# Patient Record
Sex: Female | Born: 1980 | Race: Black or African American | Hispanic: No | Marital: Married | Smoking: Never smoker
Health system: Southern US, Community
[De-identification: ages and names within clinical notes are randomized; demographics above are authoritative.]

## PROBLEM LIST (undated history)

## (undated) DIAGNOSIS — K589 Irritable bowel syndrome without diarrhea: Secondary | ICD-10-CM

## (undated) HISTORY — DX: Irritable bowel syndrome without diarrhea: K58.9

---

## 2004-05-17 ENCOUNTER — Ambulatory Visit: Payer: Self-pay | Admitting: Gastroenterology

## 2008-01-04 ENCOUNTER — Encounter: Admission: RE | Admit: 2008-01-04 | Discharge: 2008-01-04 | Payer: Self-pay | Admitting: Unknown Physician Specialty

## 2008-01-09 ENCOUNTER — Emergency Department (HOSPITAL_BASED_OUTPATIENT_CLINIC_OR_DEPARTMENT_OTHER): Admission: EM | Admit: 2008-01-09 | Discharge: 2008-01-09 | Payer: Self-pay | Admitting: Emergency Medicine

## 2008-07-24 ENCOUNTER — Emergency Department (HOSPITAL_COMMUNITY): Admission: EM | Admit: 2008-07-24 | Discharge: 2008-07-24 | Payer: Self-pay | Admitting: Emergency Medicine

## 2011-10-31 ENCOUNTER — Encounter: Payer: Self-pay | Admitting: *Deleted

## 2011-10-31 ENCOUNTER — Emergency Department
Admission: EM | Admit: 2011-10-31 | Discharge: 2011-10-31 | Disposition: A | Discharge status: Home / Self Care | Payer: 59 | Source: Home / Self Care

## 2011-10-31 DIAGNOSIS — R21 Rash and other nonspecific skin eruption: Secondary | ICD-10-CM

## 2011-10-31 DIAGNOSIS — R198 Other specified symptoms and signs involving the digestive system and abdomen: Secondary | ICD-10-CM

## 2011-10-31 NOTE — ED Provider Notes (Signed)
History     CSN: [id number]  Arrival date & time 10/31/11  1533   First MD Initiated Contact with Patient 10/31/11 1548      Chief Complaint  Patient presents with  . Rash  . Diarrhea   HPI  This patient complains of a RASH  Location: bilateral flexor portion of forearms.   Onset: 2 weeks   Course: Pt noticed non itchy non pruritic rash on forearm for the last 2 weeks. Rash has been intermittent and mild. Rash is not very noticeable today.   Pt also with intermittent constipation and NBNB diarrhea over last month. Pt denies any recent traumas or infections. No prior history of abdominal pain. Mom with prior hx/o lupus. Pt has never been diagnosed with lupus or other autoimmune diseases in the past.  Both abdominal pain and rash are currently clinically near resolved.  BMs have been normal Pt states that she has been evaluation for ?celiac/lactose intolerance. Testing was negative per report.   Self-treated with: nothing.   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   New soaps, shampoos, detergent, clothing: no  Tick/insect exposure: no  Red Flags  Feeling ill: no  Fever: no Facial/tongue swelling/difficulty breathing: no  Diabetic or immunocompromised: no    History reviewed. No pertinent past medical history.  History reviewed. No pertinent past surgical history.  History reviewed. No pertinent family history.  History  Substance Use Topics  . Smoking status: Never Smoker   . Smokeless tobacco: Not on file  . Alcohol Use: No    OB History    Grav Para Term Preterm Abortions TAB SAB Ect Mult Living                  Review of Systems  All other systems reviewed and are negative.    Allergies  Review of patient's allergies indicates no known allergies.  Home Medications  No current outpatient prescriptions  on file.  BP 108/71  Pulse 82  Resp 14  Ht 5\' 9"  (1.753 m)  Wt 130 lb (58.968 kg)  BMI 19.20 kg/m2  SpO2 100%  LMP 10/29/2011  Physical Exam  Constitutional: She is oriented to person, place, and time. She appears well-developed and well-nourished.  HENT:  Head: Normocephalic and atraumatic.  Right Ear: External ear normal.  Left Ear: External ear normal.  Mouth/Throat: Oropharynx is clear and moist.       No oral lesions    Eyes: Conjunctivae are normal. Pupils are equal, round, and reactive to light.  Neck: Normal range of motion. Neck supple.  Cardiovascular: Normal rate and regular rhythm.   Pulmonary/Chest: Effort normal and breath sounds normal.  Abdominal: Soft. Bowel sounds are normal. She exhibits no distension and no mass. There is no tenderness. There is no guarding.  Musculoskeletal: Normal range of motion.  Lymphadenopathy:    She has no cervical adenopathy.  Neurological: She is alert and oriented to person, place, and time.  Skin: Skin is warm.       No discernable rash on intense inspection of forearms.     ED Course  Procedures (including critical care time)  Labs Reviewed - No data to display No results found.   No diagnosis found.    MDM  Unsure if this is a clinically significant rash as pt reports that rash is minimally visible today  in addition to abdominal complaints currently being minimal.  Ddx includes enteropathic related rash vs. IBS vs. Nonspecific dermatitis. Pt is overall well appearing. Unclear if there is any significant workup that needs to be done currently as pt is asymptomatic.  Had relatively lengthy discussion with pt about options including extensive bloodwork ( ie ESR, antigliadin Abd, CBC). Pt would prefer to decline this as pt has formal GI follow up in 4 days.  Discussed with pt to come in for appt if rash flares significantly.           Doree Albee, MD 10/31/11 [phone]

## 2011-10-31 NOTE — ED Notes (Signed)
Patient reports rash to bilateral forearms x 2 weeks. She also reports intermittent constipation and diarrhea x 3-4 weeks. SHe has an apt with GI next week. Last BM yesterday.

## 2013-03-08 ENCOUNTER — Emergency Department (INDEPENDENT_AMBULATORY_CARE_PROVIDER_SITE_OTHER)
Admission: EM | Admit: 2013-03-08 | Discharge: 2013-03-08 | Disposition: A | Discharge status: Home / Self Care | Payer: 59 | Source: Home / Self Care | Attending: Family Medicine | Admitting: Family Medicine

## 2013-03-08 ENCOUNTER — Encounter: Payer: Self-pay | Admitting: Emergency Medicine

## 2013-03-08 DIAGNOSIS — J111 Influenza due to unidentified influenza virus with other respiratory manifestations: Secondary | ICD-10-CM

## 2013-03-08 DIAGNOSIS — R110 Nausea: Secondary | ICD-10-CM

## 2013-03-08 DIAGNOSIS — R05 Cough: Secondary | ICD-10-CM

## 2013-03-08 DIAGNOSIS — R69 Illness, unspecified: Principal | ICD-10-CM

## 2013-03-08 DIAGNOSIS — R059 Cough, unspecified: Secondary | ICD-10-CM

## 2013-03-08 MED ORDER — ONDANSETRON 4 MG PO TBDP: ORAL_TABLET | ORAL | Status: DC

## 2013-03-08 MED ORDER — ONDANSETRON 4 MG PO TBDP: 4.0000 mg | ORAL_TABLET | Freq: Once | ORAL | Status: DC

## 2013-03-08 MED ORDER — OSELTAMIVIR PHOSPHATE 75 MG PO CAPS: 75.0000 mg | ORAL_CAPSULE | Freq: Two times a day (BID) | ORAL | Status: DC

## 2013-03-08 MED ORDER — GUAIFENESIN-CODEINE 100-10 MG/5ML PO SOLN: ORAL | Status: DC

## 2013-03-08 NOTE — ED Notes (Signed)
Dry cough, nausea, runny nose, congestion, chills x 2 days

## 2013-03-08 NOTE — ED Provider Notes (Addendum)
CSN: [id number]     Arrival date & time 03/08/13  1127 History   First MD Initiated Contact with Patient 03/08/13 1209     Chief Complaint  Patient presents with  . Cough      HPI Comments: 48 hours ago patient developed a cough, headache, sinus congestion, mild myalgias, and felt cold.  Today she has had nausea without vomiting.   She has not had influenza immunization for this season.    The history is provided by the patient.    History reviewed. No pertinent past medical history. History reviewed. No pertinent past surgical history. No family history on file. History  Substance Use Topics  . Smoking status: Never Smoker   . Smokeless tobacco: Not on file  . Alcohol Use: No   OB History   Grav Para Term Preterm Abortions TAB SAB Ect Mult Living                 Review of Systems No sore throat + cough No pleuritic pain No wheezing + nasal congestion + post-nasal drainage No sinus pain/pressure No itchy/red eyes No earache No hemoptysis No SOB No fever, + chills + nausea No vomiting ? abdominal pain No diarrhea No urinary symptoms No skin rash + fatigue + myalgias No headache Used OTC meds without relief  Allergies  Review of patient's allergies indicates not on file.  Home Medications   Current Outpatient Rx  Name  Route  Sig  Dispense  Refill  . guaiFENesin-codeine 100-10 MG/5ML syrup      Take 10mL by mouth at bedtime as needed for cough   120 mL   0   . ondansetron (ZOFRAN ODT) 4 MG disintegrating tablet      Take one tab by mouth Q6hr prn nausea   10 tablet   0   . oseltamivir (TAMIFLU) 75 MG capsule   Oral   Take 1 capsule (75 mg total) by mouth every 12 (twelve) hours.   10 capsule   0    BP 109/74  Pulse 124  Temp(Src) 99.6 F (37.6 C) (Oral)  Ht 5\' 9"  (1.753 m)  Wt 137 lb (62.143 kg)  BMI 20.22 kg/m2  SpO2 99% Physical Exam Nursing notes and Vital Signs reviewed. Appearance:  Patient appears healthy, stated age, and in no  acute distress Eyes:  Pupils are equal, round, and reactive to light and accomodation.  Extraocular movement is intact.  Conjunctivae are not inflamed  Ears:  Canals normal.  Tympanic membranes normal.  Nose:  Mildly congested turbinates.  No sinus tenderness.   Pharynx:  Normal Neck:  Supple.  Slightly tender shotty posterior nodes are palpated bilaterally  Lungs:  Clear to auscultation.  Breath sounds are equal.  Heart:  Regular rate and rhythm without murmurs, rubs, or gallops.  Abdomen:  Nontender without masses or hepatosplenomegaly.  Bowel sounds are present.  No CVA or flank tenderness.  Extremities:  No edema.  No calf tenderness Skin:  No rash present.   ED Course  Procedures  none      MDM   1. Influenza-like illness   2. Nausea alone    Zofran 4mg  ODT in office.  Rx for Zofran. Begin Tamiflu.  Robitussin AC at bedtime prn. Take plain Mucinex (1200 mg guaifenesin) twice daily for cough and congestion.  May add Sudafed for sinus congestion.   Increase fluid intake, rest. May use Afrin nasal spray (or generic oxymetazoline) twice daily for about 5 days.  Also  recommend using saline nasal spray several times daily and saline nasal irrigation (AYR is a common brand)  Stop all antihistamines for now, and other non-prescription cough/cold preparations. May take Ibuprofen 200mg , 4 tabs every 8 hours with food for chest/sternum discomfort, headache, etc.. Begin clear liquids for 12 hours (may include Jello), then gradually advance diet.  Followup with Family Doctor if not improved in one week.     Lattie Haw, MD 03/09/13 1800  Addendum: Will write Rx for prophylactic Tamiflu for two family members.  Lattie Haw, MD 03/09/13 [phone]

## 2013-03-08 NOTE — Discharge Instructions (Signed)
Take plain Mucinex (1200 mg guaifenesin) twice daily for cough and congestion.  May add Sudafed for sinus congestion.   Increase fluid intake, rest. May use Afrin nasal spray (or generic oxymetazoline) twice daily for about 5 days.  Also recommend using saline nasal spray several times daily and saline nasal irrigation (AYR is a common brand)  Stop all antihistamines for now, and other non-prescription cough/cold preparations. May take Ibuprofen 200mg , 4 tabs every 8 hours with food for chest/sternum discomfort, headache, etc.. Begin clear liquids for 12 hours (may include Jello), then gradually advance diet.    Influenza, Adult Influenza ("the flu") is a viral infection of the respiratory tract. It occurs more often in winter months because people spend more time in close contact with one another. Influenza can make you feel very sick. Influenza easily spreads from person to person (contagious). CAUSES  Influenza is caused by a virus that infects the respiratory tract. You can catch the virus by breathing in droplets from an infected person's cough or sneeze. You can also catch the virus by touching something that was recently contaminated with the virus and then touching your mouth, nose, or eyes. SYMPTOMS  Symptoms typically last 4 to 10 days and may include:  Fever.  Chills.  Headache, body aches, and muscle aches.  Sore throat.  Chest discomfort and cough.  Poor appetite.  Weakness or feeling tired.  Dizziness.  Nausea or vomiting. DIAGNOSIS  Diagnosis of influenza is often made based on your history and a physical exam. A nose or throat swab test can be done to confirm the diagnosis. RISKS AND COMPLICATIONS You may be at risk for a more severe case of influenza if you smoke cigarettes, have diabetes, have chronic heart disease (such as heart failure) or lung disease (such as asthma), or if you have a weakened immune system. Elderly people and pregnant women are also at risk  for more serious infections. The most common complication of influenza is a lung infection (pneumonia). Sometimes, this complication can require emergency medical care and may be life-threatening. PREVENTION  An annual influenza vaccination (flu shot) is the best way to avoid getting influenza. An annual flu shot is now routinely recommended for all adults in the [place] TREATMENT  In mild cases, influenza goes away on its own. Treatment is directed at relieving symptoms. For more severe cases, your caregiver may prescribe antiviral medicines to shorten the sickness. Antibiotic medicines are not effective, because the infection is caused by a virus, not by bacteria. HOME CARE INSTRUCTIONS  Only take over-the-counter or prescription medicines for pain, discomfort, or fever as directed by your caregiver.  Use a cool mist humidifier to make breathing easier.  Get plenty of rest until your temperature returns to normal. This usually takes 3 to 4 days.  Drink enough fluids to keep your urine clear or pale yellow.  Cover your mouth and nose when coughing or sneezing, and wash your hands well to avoid spreading the virus.  Stay home from work or school until your fever has been gone for at least 1 full day. SEEK MEDICAL CARE IF:   You have chest pain or a deep cough that worsens or produces more mucus.  You have nausea, vomiting, or diarrhea. SEEK IMMEDIATE MEDICAL CARE IF:   You have difficulty breathing, shortness of breath, or your skin or nails turn bluish.  You have severe neck pain or stiffness.  You have a severe headache, facial pain, or earache.  You have a  worsening or recurring fever.  You have nausea or vomiting that cannot be controlled. MAKE SURE YOU:  Understand these instructions.  Will watch your condition.  Will get help right away if you are not doing well or get worse. Document Released: 02/09/2000 Document Revised: 08/13/2011 Document Reviewed:  05/13/2011 [facility] Patient Information 2014 [place], [place].

## 2013-12-28 ENCOUNTER — Emergency Department (HOSPITAL_BASED_OUTPATIENT_CLINIC_OR_DEPARTMENT_OTHER)
Admission: EM | Admit: 2013-12-28 | Discharge: 2013-12-28 | Disposition: A | Discharge status: Home / Self Care | Payer: Commercial Managed Care - PPO | Attending: Emergency Medicine | Admitting: Emergency Medicine

## 2013-12-28 ENCOUNTER — Encounter (HOSPITAL_BASED_OUTPATIENT_CLINIC_OR_DEPARTMENT_OTHER): Payer: Self-pay

## 2013-12-28 ENCOUNTER — Emergency Department (HOSPITAL_BASED_OUTPATIENT_CLINIC_OR_DEPARTMENT_OTHER): Payer: Commercial Managed Care - PPO

## 2013-12-28 DIAGNOSIS — M79674 Pain in right toe(s): Secondary | ICD-10-CM

## 2013-12-28 DIAGNOSIS — M79671 Pain in right foot: Secondary | ICD-10-CM

## 2013-12-28 DIAGNOSIS — W1839XA Other fall on same level, initial encounter: Secondary | ICD-10-CM | POA: Diagnosis not present

## 2013-12-28 DIAGNOSIS — S93402A Sprain of unspecified ligament of left ankle, initial encounter: Secondary | ICD-10-CM | POA: Diagnosis not present

## 2013-12-28 DIAGNOSIS — S99912A Unspecified injury of left ankle, initial encounter: Secondary | ICD-10-CM | POA: Diagnosis present

## 2013-12-28 DIAGNOSIS — Y9289 Other specified places as the place of occurrence of the external cause: Secondary | ICD-10-CM | POA: Insufficient documentation

## 2013-12-28 DIAGNOSIS — Y9339 Activity, other involving climbing, rappelling and jumping off: Secondary | ICD-10-CM | POA: Diagnosis not present

## 2013-12-28 DIAGNOSIS — Z79899 Other long term (current) drug therapy: Secondary | ICD-10-CM | POA: Insufficient documentation

## 2013-12-28 DIAGNOSIS — M25571 Pain in right ankle and joints of right foot: Secondary | ICD-10-CM | POA: Insufficient documentation

## 2013-12-28 NOTE — ED Provider Notes (Signed)
CSN: [id number]     Arrival date & time 12/28/13  0845 History   First MD Initiated Contact with Patient 12/28/13 [phone]     Chief Complaint  Patient presents with  . Ankle Pain     (Consider location/radiation/quality/duration/timing/severity/associated sxs/prior Treatment) HPI Comments: Patient is a 33 year old female who complains of left ankle and foot pain. She states she was jumping on a trampoline on Saturday and came down awkwardly. Since that time she has had pain in the top of her foot and lateral ankle. Pain is worse with ambulation and relieved with rest.  Patient is a 33 y.o. female presenting with ankle pain. The history is provided by the patient.  Ankle Pain Location:  Ankle Time since incident:  3 days Injury: yes   Mechanism of injury: fall   Fall:    Fall occurred: jumping on a trampoline. Ankle location:  L ankle   History reviewed. No pertinent past medical history. History reviewed. No pertinent past surgical history. No family history on file. History  Substance Use Topics  . Smoking status: Never Smoker   . Smokeless tobacco: Not on file  . Alcohol Use: No   OB History    No data available     Review of Systems  All other systems reviewed and are negative.     Allergies  Review of patient's allergies indicates no known allergies.  Home Medications   Prior to Admission medications   Medication Sig Start Date End Date Taking? Authorizing Provider  Multiple Vitamin (MULTIVITAMIN) tablet Take 1 tablet by mouth daily.   Yes Historical Provider, MD  guaiFENesin-codeine 100-10 MG/5ML syrup Take 10mL by mouth at bedtime as needed for cough 03/08/13   Lattie HawStephen A Beese, MD  ondansetron (ZOFRAN ODT) 4 MG disintegrating tablet Take one tab by mouth Q6hr prn nausea 03/08/13   Lattie HawStephen A Beese, MD  oseltamivir (TAMIFLU) 75 MG capsule Take 1 capsule (75 mg total) by mouth every 12 (twelve) hours. 03/08/13   Lattie HawStephen A Beese, MD   BP 116/64 mmHg  Pulse 93   Temp(Src) 98.1 F (36.7 C) (Oral)  Resp 18  Ht 5\' 9"  (1.753 m)  Wt 125 lb (56.7 kg)  BMI 18.45 kg/m2  SpO2 100%  LMP 12/28/2013 Physical Exam  Constitutional: She is oriented to person, place, and time. She appears well-developed and well-nourished. No distress.  HENT:  Head: Normocephalic and atraumatic.  Neck: Normal range of motion. Neck supple.  Musculoskeletal: Normal range of motion.  The left foot and ankle appear grossly normal. There is perhaps mild swelling to the dorsum of left foot and lower anterior ankle. There is mild tenderness to palpation over the distal fibula. There is no fifth metatarsal tenderness and no proximal fibular tenderness.  Neurological: She is alert and oriented to person, place, and time.  Skin: Skin is warm and dry. She is not diaphoretic.  Nursing note and vitals reviewed.   ED Course  Procedures (including critical care time) Labs Review Labs Reviewed - No data to display  Imaging Review No results found.   EKG Interpretation None      MDM   Final diagnoses:  None    X-rays are negative for fracture. This is most likely a sprain and will be treated with rest and ibuprofen. She also had complaints to the radiology tech about a right foot pain she has been having for one year. This was also x-rayed and revealed only a mild hallux valgus of the right great  toe. If this persists she is to follow-up with podiatry.    Geoffery Lyonsouglas Dorion Petillo, MD 12/28/13 1009

## 2013-12-28 NOTE — ED Notes (Signed)
Injury to left ankle and foot since Saturday.  Injury occurred while jumping on the trampoline.

## 2013-12-28 NOTE — Discharge Instructions (Signed)
Rest. Ibuprofen 600 mg every 6 hours as needed for pain.  Follow-up with your primary Dr. If not improving in the next week, and return to the ER if your symptoms substantially worsen or change.   Ankle Sprain An ankle sprain is an injury to the strong, fibrous tissues (ligaments) that hold the bones of your ankle joint together.  CAUSES An ankle sprain is usually caused by a fall or by twisting your ankle. Ankle sprains most commonly occur when you step on the outer edge of your foot, and your ankle turns inward. People who participate in sports are more prone to these types of injuries.  SYMPTOMS   Pain in your ankle. The pain may be present at rest or only when you are trying to stand or walk.  Swelling.  Bruising. Bruising may develop immediately or within 1 to 2 days after your injury.  Difficulty standing or walking, particularly when turning corners or changing directions. DIAGNOSIS  Your caregiver will ask you details about your injury and perform a physical exam of your ankle to determine if you have an ankle sprain. During the physical exam, your caregiver will press on and apply pressure to specific areas of your foot and ankle. Your caregiver will try to move your ankle in certain ways. An X-ray exam may be done to be sure a bone was not broken or a ligament did not separate from one of the bones in your ankle (avulsion fracture).  TREATMENT  Certain types of braces can help stabilize your ankle. Your caregiver can make a recommendation for this. Your caregiver may recommend the use of medicine for pain. If your sprain is severe, your caregiver may refer you to a surgeon who helps to restore function to parts of your skeletal system (orthopedist) or a physical therapist. HOME CARE INSTRUCTIONS   Apply ice to your injury for 1-2 days or as directed by your caregiver. Applying ice helps to reduce inflammation and pain.  Put ice in a plastic bag.  Place a towel between your skin  and the bag.  Leave the ice on for 15-20 minutes at a time, every 2 hours while you are awake.  Only take over-the-counter or prescription medicines for pain, discomfort, or fever as directed by your caregiver.  Elevate your injured ankle above the level of your heart as much as possible for 2-3 days.  If your caregiver recommends crutches, use them as instructed. Gradually put weight on the affected ankle. Continue to use crutches or a cane until you can walk without feeling pain in your ankle.  If you have a plaster splint, wear the splint as directed by your caregiver. Do not rest it on anything harder than a pillow for the first 24 hours. Do not put weight on it. Do not get it wet. You may take it off to take a shower or bath.  You may have been given an elastic bandage to wear around your ankle to provide support. If the elastic bandage is too tight (you have numbness or tingling in your foot or your foot becomes cold and blue), adjust the bandage to make it comfortable.  If you have an air splint, you may blow more air into it or let air out to make it more comfortable. You may take your splint off at night and before taking a shower or bath. Wiggle your toes in the splint several times per day to decrease swelling. SEEK MEDICAL CARE IF:   You have  rapidly increasing bruising or swelling.  Your toes feel extremely cold or you lose feeling in your foot.  Your pain is not relieved with medicine. SEEK IMMEDIATE MEDICAL CARE IF:  Your toes are numb or blue.  You have severe pain that is increasing. MAKE SURE YOU:   Understand these instructions.  Will watch your condition.  Will get help right away if you are not doing well or get worse. Document Released: 02/11/2005 Document Revised: 11/06/2011 Document Reviewed: 02/23/2011 [facility] Patient Information 2015 [place], [place]. This information is not intended to replace advice given to you by your health care provider. Make sure  you discuss any questions you have with your health care provider.

## 2015-07-18 IMAGING — CR DG FOOT COMPLETE 3+V*R*
3 series · 3 of 3 positions shown · non-contrast
Comparison: Left foot series of today's date

CLINICAL DATA: Intermittent right great toe pain for the past year
without history of trauma

EXAM:
RIGHT FOOT COMPLETE - 3+ VIEW

[t foot ap right]
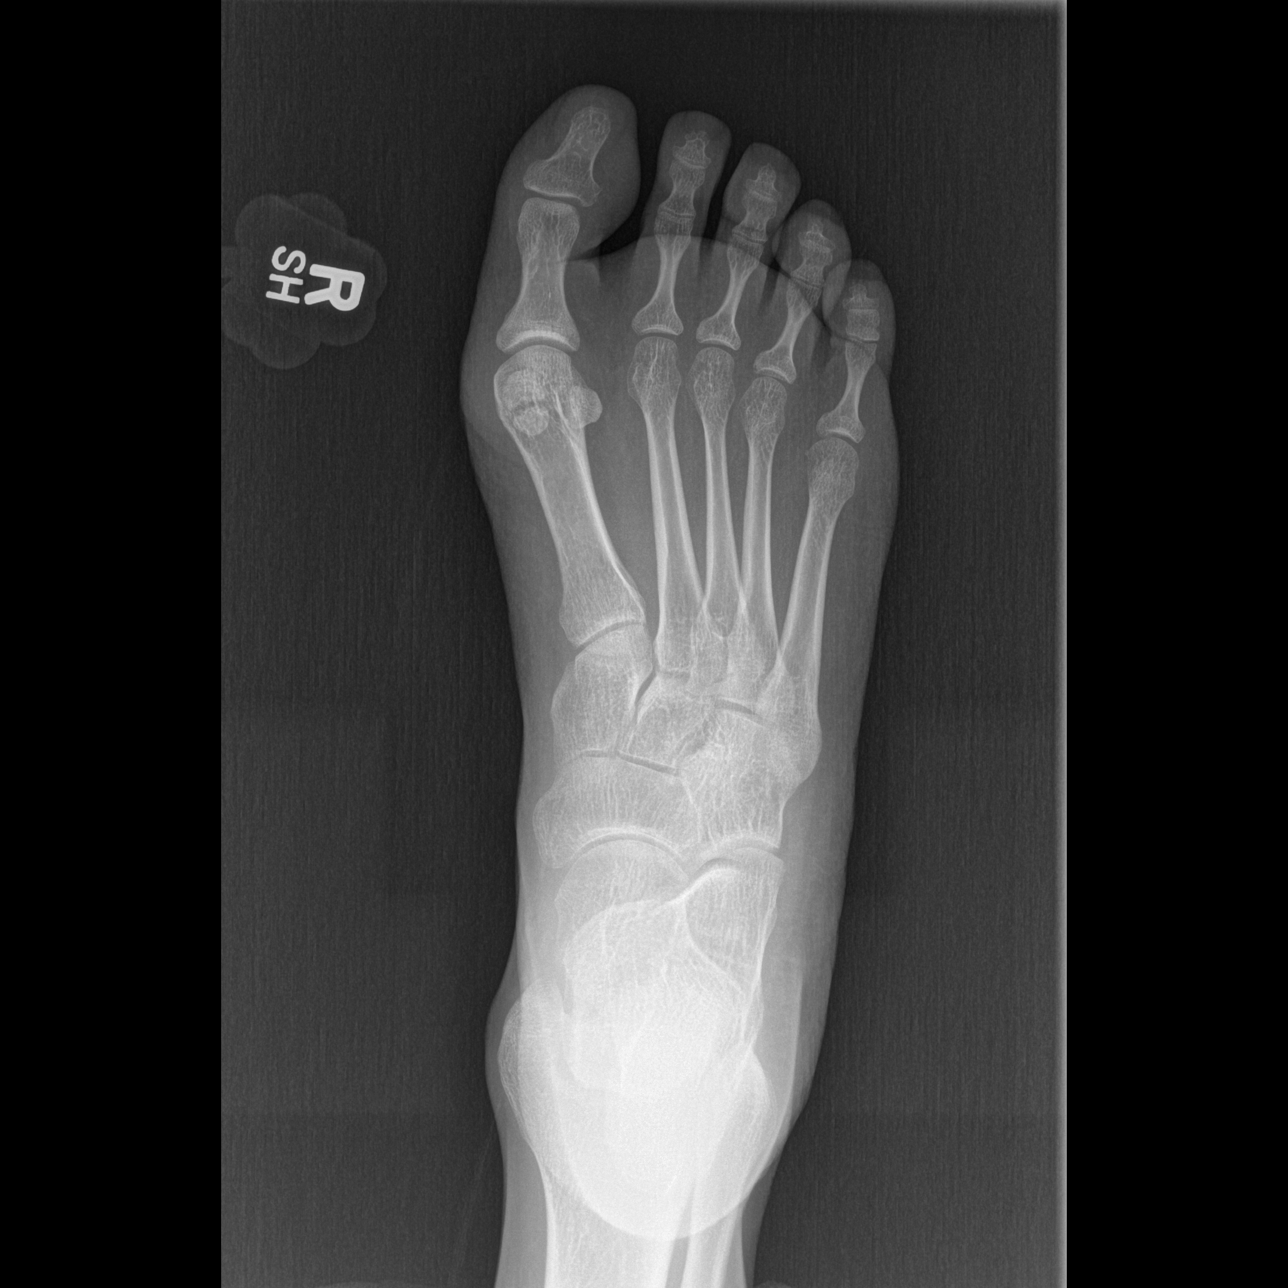

[t foot oblique right]
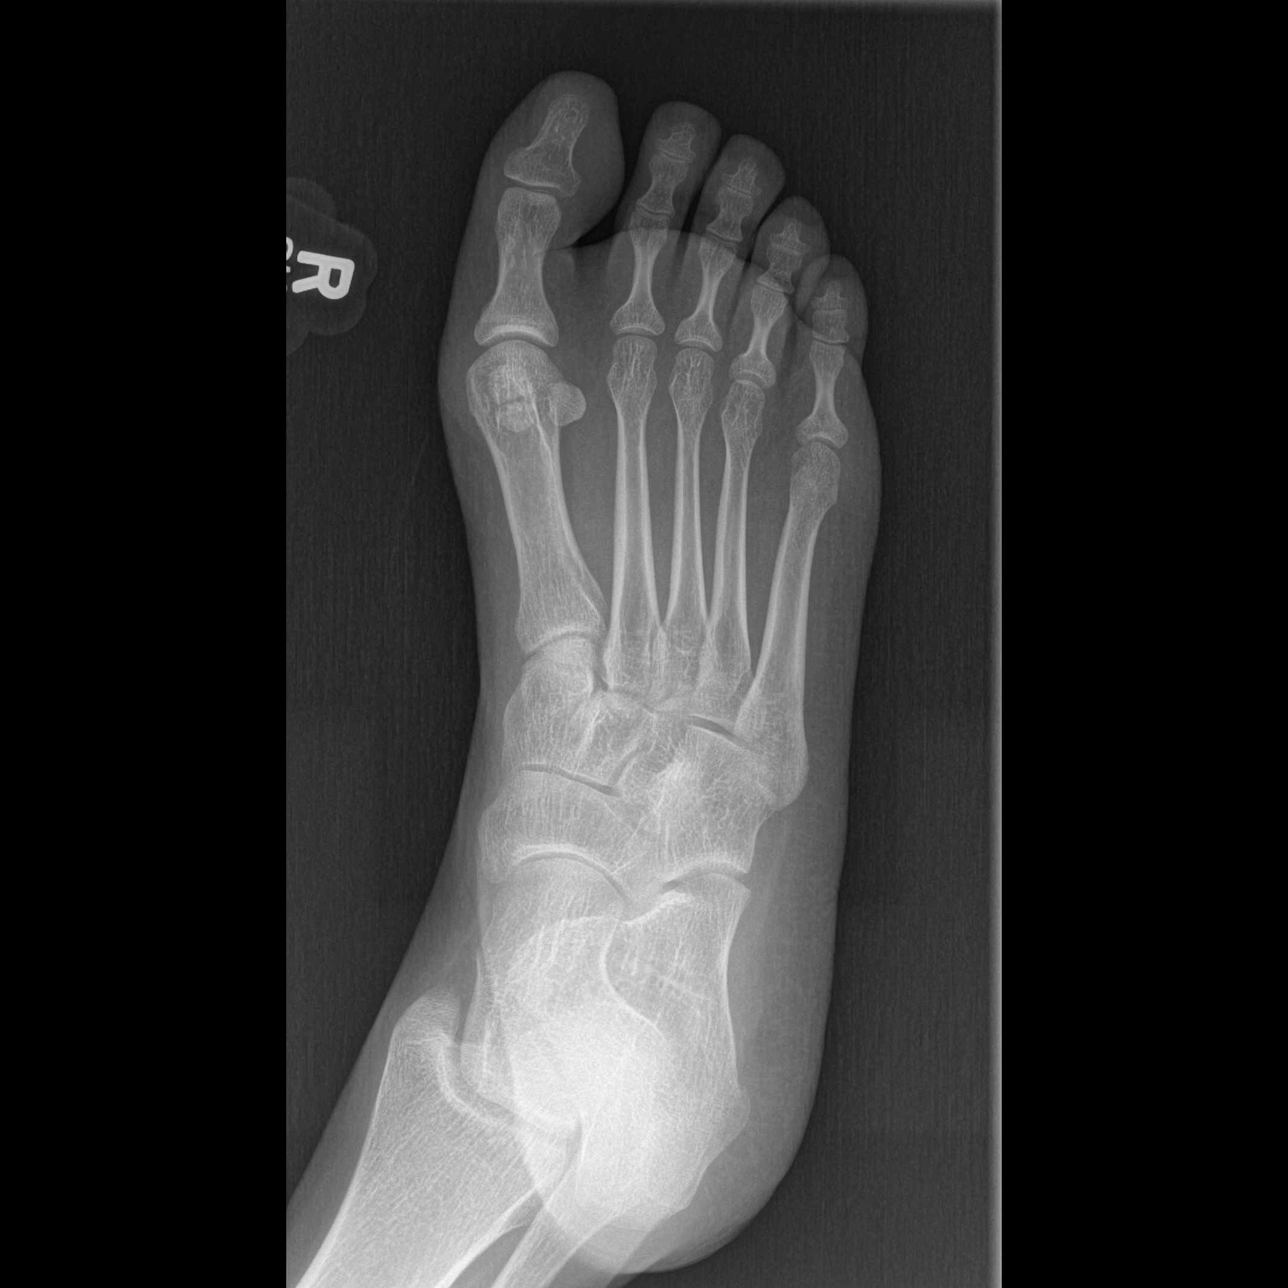

[t foot lat right]
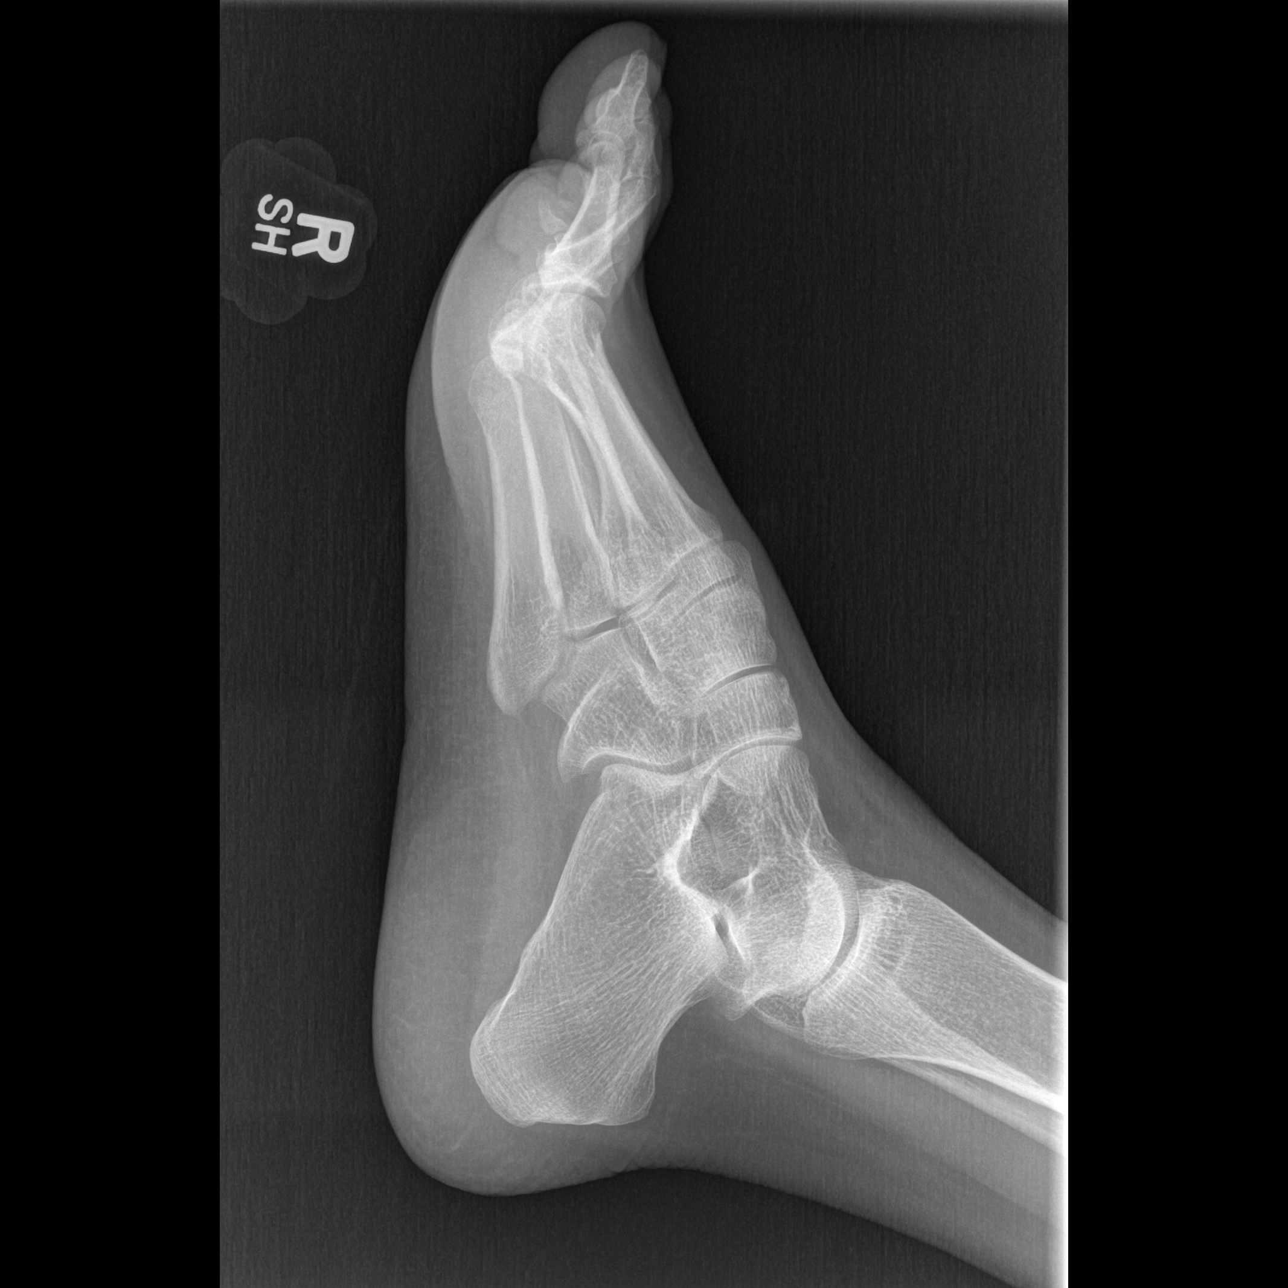

[3 of 3 positions shown; findings below may reference images not displayed]

FINDINGS: The bones of the right foot are adequately mineralized. There is no
acute or healing fracture. The interphalangeal joints, the
metatarsophalangeal joints, and the tarsometatarsal joints are
unremarkable. There is a mild hallux valgus contour of the foot
similar to that seen on the left. There is a bipartite medial
sesamoid at the first metatarsal phalangeal joint the bones of the
hindfoot are intact. The soft tissues are unremarkable.
IMPRESSION: There is no acute or significant chronic bony abnormality of the
right foot. A very mild hallux valgus type contour of the first ray
is present similar to that seen on the left.

## 2015-09-29 ENCOUNTER — Ambulatory Visit (INDEPENDENT_AMBULATORY_CARE_PROVIDER_SITE_OTHER): Payer: Commercial Managed Care - PPO | Admitting: Podiatry

## 2015-09-29 ENCOUNTER — Encounter: Payer: Self-pay | Admitting: Podiatry

## 2015-09-29 ENCOUNTER — Ambulatory Visit: Payer: Self-pay

## 2015-09-29 ENCOUNTER — Ambulatory Visit (INDEPENDENT_AMBULATORY_CARE_PROVIDER_SITE_OTHER): Payer: Commercial Managed Care - PPO

## 2015-09-29 VITALS — BP 101/72 | HR 79 | Resp 16 | Ht 69.0 in | Wt 135.0 lb

## 2015-09-29 DIAGNOSIS — M79672 Pain in left foot: Secondary | ICD-10-CM | POA: Diagnosis not present

## 2015-09-29 DIAGNOSIS — M79673 Pain in unspecified foot: Secondary | ICD-10-CM | POA: Insufficient documentation

## 2015-09-29 DIAGNOSIS — S92912A Unspecified fracture of left toe(s), initial encounter for closed fracture: Secondary | ICD-10-CM | POA: Diagnosis not present

## 2015-09-29 DIAGNOSIS — M79671 Pain in right foot: Secondary | ICD-10-CM

## 2015-09-29 DIAGNOSIS — S92911A Unspecified fracture of right toe(s), initial encounter for closed fracture: Secondary | ICD-10-CM | POA: Insufficient documentation

## 2015-09-29 NOTE — Progress Notes (Signed)
   Subjective:    Patient ID: Joan Velasquez, female    DOB: 11/28/1980, 35 y.o.   MRN: [id number]  HPI Chief Complaint  Patient presents with  . Toe Pain    Left foot; 5th toe & arch; pt stated, "Broke toe last June 2016 and hit toe again in early January 2017"      Review of Systems  All other systems reviewed and are negative.      Objective:   Physical Exam        Assessment & Plan:

## 2015-09-29 NOTE — Progress Notes (Signed)
Subjective:     Patient ID: Joan Velasquez, female   DOB: 03/30/1980, 35 y.o.   MRN: [id number]  HPI patient presents left fifth toe stating that she traumatized it a number weeks ago and it still been swollen and she wants to make sure it is not fracture   Review of Systems  All other systems reviewed and are negative.      Objective:   Physical Exam  Constitutional: She is oriented to person, place, and time.  Cardiovascular: Intact distal pulses.   Musculoskeletal: Normal range of motion.  Neurological: She is oriented to person, place, and time.  Skin: Skin is warm.  Nursing note and vitals reviewed.  neurovascular status intact muscle strength adequate range of motion within normal limits with patient found to have a inflamed fifth digit left that's mildly swollen and mildly tender but does not appear to be unstable. Found have good digital perfusion and well oriented 3     Assessment:     Lesion that is secondary to pressure that is possibility with fracture of the toe    Plan:     H&P x-rays reviewed and recommended buttress taping the toe which was accomplished today. Should heal uneventfully but   may take another 4-6 weeks  X-ray report indicates no indication of acute fracture the fifth toe

## 2020-07-22 ENCOUNTER — Emergency Department (HOSPITAL_BASED_OUTPATIENT_CLINIC_OR_DEPARTMENT_OTHER)
Admission: EM | Admit: 2020-07-22 | Discharge: 2020-07-22 | Disposition: A | Discharge status: Home / Self Care | Payer: 59 | Attending: Emergency Medicine | Admitting: Emergency Medicine

## 2020-07-22 ENCOUNTER — Other Ambulatory Visit: Payer: Self-pay

## 2020-07-22 ENCOUNTER — Encounter (HOSPITAL_BASED_OUTPATIENT_CLINIC_OR_DEPARTMENT_OTHER): Payer: Self-pay

## 2020-07-22 DIAGNOSIS — W28XXXA Contact with powered lawn mower, initial encounter: Secondary | ICD-10-CM | POA: Diagnosis not present

## 2020-07-22 DIAGNOSIS — Z23 Encounter for immunization: Secondary | ICD-10-CM | POA: Diagnosis not present

## 2020-07-22 DIAGNOSIS — S6992XA Unspecified injury of left wrist, hand and finger(s), initial encounter: Secondary | ICD-10-CM | POA: Diagnosis present

## 2020-07-22 DIAGNOSIS — S61211A Laceration without foreign body of left index finger without damage to nail, initial encounter: Secondary | ICD-10-CM | POA: Insufficient documentation

## 2020-07-22 MED ORDER — LIDOCAINE HCL (PF) 1 % IJ SOLN
5.0000 mL | Freq: Once | INTRAMUSCULAR | Status: AC
  Administered 2020-07-22: 5 mL
  Filled 2020-07-22: qty 5

## 2020-07-22 MED ORDER — TETANUS-DIPHTH-ACELL PERTUSSIS 5-2.5-18.5 LF-MCG/0.5 IM SUSY
0.5000 mL | PREFILLED_SYRINGE | Freq: Once | INTRAMUSCULAR | Status: AC
  Administered 2020-07-22: 0.5 mL via INTRAMUSCULAR
  Filled 2020-07-22: qty 0.5

## 2020-07-22 NOTE — ED Provider Notes (Signed)
[facility] EMERGENCY DEPARTMENT Provider Note   CSN: [id number] Arrival date & time: 07/22/20  1206     History Chief Complaint  Patient presents with  . Finger Injury    Joan Velasquez is a 40 y.o. female.  Patient is a 40 year old female who presents with a laceration to her finger.  She was using hedge tremors and her left index finger got scraped by the edge of the hedge trimmer.  This happened just prior to arrival.  She denies any numbness or weakness to the finger.  No other injuries.  Her tetanus shot is not up-to-date.        Past Medical History:  Diagnosis Date  . IBS (irritable bowel syndrome)     Patient Active Problem List   Diagnosis Date Noted  . Fracture of right toe 09/29/2015  . Pain in foot 09/29/2015    History reviewed. No pertinent surgical history.   OB History   No obstetric history on file.     History reviewed. No pertinent family history.  Social History   Tobacco Use  . Smoking status: Never Smoker  Substance Use Topics  . Alcohol use: No  . Drug use: No    Home Medications Prior to Admission medications   Not on File    Allergies    Patient has no known allergies.  Review of Systems   Review of Systems  Constitutional: Negative for fever.  Gastrointestinal: Negative for nausea and vomiting.  Musculoskeletal: Negative for arthralgias, back pain, joint swelling and neck pain.  Skin: Positive for wound.  Neurological: Negative for weakness, numbness and headaches.    Physical Exam Updated Vital Signs BP 115/84 (BP Location: Right Arm)   Pulse 91   Temp 98.5 F (36.9 C) (Oral)   Resp 18   Ht 5\' 9"  (1.753 m)   Wt 68 kg   LMP 07/15/2020   SpO2 100%   BMI 22.15 kg/m   Physical Exam Constitutional:      Appearance: She is well-developed.  HENT:     Head: Normocephalic and atraumatic.  Cardiovascular:     Rate and Rhythm: Normal rate.  Pulmonary:     Effort: Pulmonary effort is normal.   Musculoskeletal:        General: Tenderness present.     Cervical back: Normal range of motion and neck supple.     Comments: Patient has a 1 cm jagged laceration to her left index finger over the volar surface overlying the middle phalanx.  There is no active bleeding.  She has normal sensation and motor function distally.  Capillary refills less than 2 distally.  Skin:    General: Skin is warm and dry.  Neurological:     Mental Status: She is alert and oriented to person, place, and time.     ED Results / Procedures / Treatments   Labs (all labs ordered are listed, but only abnormal results are displayed) Labs Reviewed - No data to display  EKG None  Radiology No results found.  Procedures .05/23/2022Laceration Repair  Date/Time: 07/22/2020 2:35 PM Performed by: 07/24/2020, MD Authorized by: Rolan Bucco, MD   Consent:    Consent obtained:  Verbal   Consent given by:  Patient   Risks, benefits, and alternatives were discussed: yes     Risks discussed:  Poor wound healing and infection   Alternatives discussed:  No treatment Anesthesia:    Anesthesia method:  Local infiltration   Local anesthetic:  Lidocaine 1% w/o epi Laceration details:    Location:  Finger   Finger location:  L index finger   Length (cm):  1 Pre-procedure details:    Preparation:  Patient was prepped and draped in usual sterile fashion Exploration:    Wound exploration: wound explored through full range of motion and entire depth of wound visualized     Wound extent: no foreign bodies/material noted, no muscle damage noted, no nerve damage noted and no vascular damage noted     Contaminated: no   Treatment:    Area cleansed with:  Saline   Amount of cleaning:  Standard   Irrigation solution:  Sterile saline   Irrigation method:  Syringe   Visualized foreign bodies/material removed: no     Debridement:  None Skin repair:    Repair method:  Sutures   Suture size:  5-0   Suture material:   Prolene   Number of sutures:  2 Approximation:    Approximation:  Close Repair type:    Repair type:  Simple Post-procedure details:    Dressing:  Antibiotic ointment   Procedure completion:  Tolerated well, no immediate complications     Medications Ordered in ED Medications  Tdap (BOOSTRIX) injection 0.5 mL (0.5 mLs Intramuscular Given 07/22/20 1348)  lidocaine (PF) (XYLOCAINE) 1 % injection 5 mL (5 mLs Infiltration Given by Other 07/22/20 1408)    ED Course  I have reviewed the triage vital signs and the nursing notes.  Pertinent labs & imaging results that were available during my care of the patient were reviewed by me and considered in my medical decision making (see chart for details).    MDM Rules/Calculators/A&P                          Patient presents with a laceration to her finger.  Initially I did not feel that it would tolerate sutures as it was jagged and fairly superficial.  However as I irrigated it, it seemed appropriate to put 2 small sutures in.  There is no bony tenderness that would be suspicious for fracture.  No evident damage to the tendons.  Patient was discharged home in good condition.  Wound care instructions were given.  Her tetanus shot was updated.  Return precautions were given. Final Clinical Impression(s) / ED Diagnoses Final diagnoses:  Laceration of left index finger without foreign body without damage to nail, initial encounter    Rx / DC Orders ED Discharge Orders    None       Rolan Bucco, MD 07/22/20 1438

## 2020-07-22 NOTE — Discharge Instructions (Signed)
Keep the wound clean and dry.  The sutures need to be removed in 7 to 10 days.  This can be done at your primary care doctor, in urgent care or the emergency department.

## 2020-07-22 NOTE — ED Triage Notes (Signed)
Pt was using weedwacker when it hit her finger. Laceration noted, bleeding controlled in triage.

## 2020-07-22 NOTE — ED Notes (Signed)
Last tetanus unknown

## 2020-08-27 ENCOUNTER — Encounter (HOSPITAL_COMMUNITY): Payer: Self-pay | Admitting: *Deleted

## 2020-08-27 ENCOUNTER — Ambulatory Visit (HOSPITAL_COMMUNITY)
Admission: EM | Admit: 2020-08-27 | Discharge: 2020-08-27 | Disposition: A | Discharge status: Home / Self Care | Payer: 59 | Attending: Family Medicine | Admitting: Family Medicine

## 2020-08-27 ENCOUNTER — Other Ambulatory Visit: Payer: Self-pay

## 2020-08-27 DIAGNOSIS — L237 Allergic contact dermatitis due to plants, except food: Secondary | ICD-10-CM | POA: Diagnosis not present

## 2020-08-27 MED ORDER — PREDNISONE 10 MG PO TABS: ORAL_TABLET | ORAL | 0 refills | Status: DC

## 2020-08-27 MED ORDER — TRIAMCINOLONE ACETONIDE 0.1 % EX CREA: 1.0000 "application " | TOPICAL_CREAM | Freq: Two times a day (BID) | CUTANEOUS | 0 refills | Status: DC

## 2020-08-27 MED ORDER — TRIAMCINOLONE ACETONIDE 0.1 % EX CREA: 1.0000 "application " | TOPICAL_CREAM | Freq: Two times a day (BID) | CUTANEOUS | 0 refills | Status: AC

## 2020-08-27 NOTE — ED Provider Notes (Signed)
[facility]    CSN: [id number] Arrival date & time: 08/27/20  1346      History   Chief Complaint Chief Complaint  Patient presents with   Rash    HPI Joan Velasquez is a 40 y.o. female.   Patient presenting today with 3-week history of a rash that started on her right side of her neck and is now spread to the left side of the neck and down onto left side of chest.  Very itchy, not painful and not draining.  Has been using some alcohol which helps temporarily with the itching.  Denies any new hygiene products or medications, supplements, foods, recent travel.  Does state that she was working outside in the yard about a month ago.   Past Medical History:  Diagnosis Date   IBS (irritable bowel syndrome)     Patient Active Problem List   Diagnosis Date Noted   Fracture of right toe 09/29/2015   Pain in foot 09/29/2015    History reviewed. No pertinent surgical history.  OB History   No obstetric history on file.      Home Medications    Prior to Admission medications   Medication Sig Start Date End Date Taking? Authorizing Provider  predniSONE (DELTASONE) 10 MG tablet Take 6 tabs daily x 2 days, then 5 tabs daily x 2 days, 4 tabs daily x 2 days, etc 08/27/20   Particia Nearing, PA-C  triamcinolone cream (KENALOG) 0.1 % Apply 1 application topically 2 (two) times daily. 08/27/20   Particia Nearing, PA-C    Family History History reviewed. No pertinent family history.  Social History Social History   Tobacco Use   Smoking status: Never   Smokeless tobacco: Never  Substance Use Topics   Alcohol use: No   Drug use: No     Allergies   Patient has no known allergies.   Review of Systems Review of Systems Per HPI  Physical Exam Triage Vital Signs ED Triage Vitals  Enc Vitals Group     BP 08/27/20 1442 117/75     Pulse Rate 08/27/20 1442 87     Resp 08/27/20 1442 18     Temp 08/27/20 1442 98.7 F (37.1 C)     Temp src --      SpO2  08/27/20 1442 100 %     Weight --      Height --      Head Circumference --      Peak Flow --      Pain Score 08/27/20 1439 0     Pain Loc --      Pain Edu? --      Excl. in GC? --    No data found.  Updated Vital Signs BP 117/75   Pulse 87   Temp 98.7 F (37.1 C)   Resp 18   LMP 08/04/2020   SpO2 100%   Visual Acuity Right Eye Distance:   Left Eye Distance:   Bilateral Distance:    Right Eye Near:   Left Eye Near:    Bilateral Near:     Physical Exam Vitals and nursing note reviewed.  Constitutional:      Appearance: Normal appearance. She is not ill-appearing.  HENT:     Head: Atraumatic.  Eyes:     Extraocular Movements: Extraocular movements intact.     Conjunctiva/sclera: Conjunctivae normal.  Cardiovascular:     Rate and Rhythm: Normal rate and regular rhythm.  Heart sounds: Normal heart sounds.  Pulmonary:     Effort: Pulmonary effort is normal.     Breath sounds: Normal breath sounds.  Musculoskeletal:        General: Normal range of motion.     Cervical back: Normal range of motion and neck supple.  Skin:    General: Skin is warm and dry.     Findings: Rash present.     Comments: Fine blistered and flesh-colored papular rash in a linear pattern coming down from the left side of neck onto left chest in small patches  Neurological:     Mental Status: She is alert and oriented to person, place, and time.  Psychiatric:        Mood and Affect: Mood normal.        Thought Content: Thought content normal.        Judgment: Judgment normal.     UC Treatments / Results  Labs (all labs ordered are listed, but only abnormal results are displayed) Labs Reviewed - No data to display  EKG   Radiology No results found.  Procedures Procedures (including critical care time)  Medications Ordered in UC Medications - No data to display  Initial Impression / Assessment and Plan / UC Course  I have reviewed the triage vital signs and the nursing  notes.  Pertinent labs & imaging results that were available during my care of the patient were reviewed by me and considered in my medical decision making (see chart for details).     Consistent with poison ivy dermatitis.  We will treat with triamcinolone cream as needed, Extended prednisone taper given chronicity and continued spread.  Follow-up if worsening or not resolving. Final Clinical Impressions(s) / UC Diagnoses   Final diagnoses:  Allergic contact dermatitis due to plants, except food   Discharge Instructions   None    ED Prescriptions     Medication Sig Dispense Auth. Provider   triamcinolone cream (KENALOG) 0.1 %  (Status: Discontinued) Apply 1 application topically 2 (two) times daily. 60 g Particia Nearing, [place]   predniSONE (DELTASONE) 10 MG tablet  (Status: Discontinued) Take 6 tabs daily x 2 days, then 5 tabs daily x 2 days, 4 tabs daily x 2 days, etc 42 tablet Particia Nearing, PA-C   predniSONE (DELTASONE) 10 MG tablet Take 6 tabs daily x 2 days, then 5 tabs daily x 2 days, 4 tabs daily x 2 days, etc 42 tablet Particia Nearing, PA-C   triamcinolone cream (KENALOG) 0.1 % Apply 1 application topically 2 (two) times daily. 60 g Particia Nearing, [place]      PDMP not reviewed this encounter.   Particia Nearing, [place] 08/27/20 1540

## 2020-08-27 NOTE — ED Triage Notes (Signed)
Pt reports a rash on upper chest to neck for 2-3 weeks.

## 2023-10-27 ENCOUNTER — Emergency Department (HOSPITAL_BASED_OUTPATIENT_CLINIC_OR_DEPARTMENT_OTHER): Admission: EM | Admit: 2023-10-27 | Discharge: 2023-10-27 | Disposition: A | Discharge status: Home / Self Care

## 2023-10-27 ENCOUNTER — Other Ambulatory Visit: Payer: Self-pay

## 2023-10-27 DIAGNOSIS — M545 Low back pain, unspecified: Secondary | ICD-10-CM | POA: Insufficient documentation

## 2023-10-27 MED ORDER — HYDROCODONE-ACETAMINOPHEN 5-325 MG PO TABS: 1.0000 | ORAL_TABLET | Freq: Four times a day (QID) | ORAL | 0 refills | Status: AC | PRN

## 2023-10-27 MED ORDER — METHOCARBAMOL 500 MG PO TABS: 1000.0000 mg | ORAL_TABLET | Freq: Four times a day (QID) | ORAL | 0 refills | Status: AC

## 2023-10-27 MED ORDER — KETOROLAC TROMETHAMINE 30 MG/ML IJ SOLN
30.0000 mg | Freq: Once | INTRAMUSCULAR | Status: AC
  Administered 2023-10-27: 30 mg via INTRAMUSCULAR
  Filled 2023-10-27: qty 1

## 2023-10-27 MED ORDER — HYDROCODONE-ACETAMINOPHEN 5-325 MG PO TABS
1.0000 | ORAL_TABLET | Freq: Once | ORAL | Status: AC
  Administered 2023-10-27: 1 via ORAL
  Filled 2023-10-27: qty 1

## 2023-10-27 NOTE — ED Triage Notes (Signed)
 Ongoing back pain but became severe yesterday. Seen at Urgent Care and was given shots but they didn't work. Reports some tingling in her legs intermittantly.

## 2023-10-27 NOTE — ED Provider Notes (Signed)
 [facility] Provider Note   CSN: [id number] Arrival date & time: 10/27/23  2027     Patient presents with: Back Pain   Joan Velasquez is a 43 y.o. female.   Patient presents for evaluation of lower back pain, history of facet arthropathy --presents for 2 days of pain.  Symptoms started yesterday, gradually worsened.  She does report working to get a standing out of a piece of clothing prior to symptoms starting.  She developed pain in the bilateral lower back, worse with movement and certain positions.  She has had some lightheadedness at times and did have a fall yesterday after the pain started causing an abrasion to her face.  She was seen at an urgent care.  She states that she got a shot of steroid and muscle relaxer.  She was not given any prescription medications.  She does not feel that these really helped.  Patient denies warning symptoms of back pain including: fecal incontinence, urinary retention or overflow incontinence, night sweats, waking from sleep with back pain, unexplained fevers or weight loss, h/o cancer, IVDU, recent trauma.          Prior to Admission medications   Medication Sig Start Date End Date Taking? Authorizing Provider  triamcinolone  cream (KENALOG ) 0.1 % Apply 1 application topically 2 (two) times daily. 08/27/20   Stuart Vernell Norris, PA-C    Allergies: Patient has no known allergies.    Review of Systems  Updated Vital Signs BP (!) 127/96 (BP Location: Left Arm)   Pulse (!) 105   Temp 98.5 F (36.9 C) (Oral)   Resp 16   Ht 5' 9 (1.753 m)   Wt 63.5 kg   LMP 09/29/2023   SpO2 98%   BMI 20.67 kg/m   Physical Exam Vitals and nursing note reviewed.  Constitutional:      Appearance: She is well-developed.     Comments: Patient holding a cane, standing against the wall in the exam room with her back flat against the wall.  HENT:     Head: Normocephalic and atraumatic.  Eyes:      Conjunctiva/sclera: Conjunctivae normal.  Pulmonary:     Effort: Pulmonary effort is normal.  Abdominal:     Palpations: Abdomen is soft.     Tenderness: There is no abdominal tenderness.  Musculoskeletal:        General: Normal range of motion.     Cervical back: Normal range of motion and neck supple.       Back:     Comments: No step-off noted with palpation of spine.   Skin:    General: Skin is warm and dry.     Findings: No rash.  Neurological:     Mental Status: She is alert.     Sensory: No sensory deficit.     Comments: 5/5 strength in entire lower extremities bilaterally. No sensation deficit.      (all labs ordered are listed, but only abnormal results are displayed) Labs Reviewed - No data to display  EKG: None  Radiology: No results found.   Procedures   Medications Ordered in the ED  ketorolac  (TORADOL ) 30 MG/ML injection 30 mg (30 mg Intramuscular Given 10/27/23 2120)  HYDROcodone -acetaminophen  (NORCO/VICODIN) 5-325 MG per tablet 1 tablet (1 tablet Oral Given 10/27/23 2119)   ED course  Patient seen and examined. History obtained directly from patient.    Labs/EKG: None ordered.  Imaging: None ordered. Considered/offered imaging of  the lumbar spine with x-ray or CT but no red flags today and feel this would be low yield.  After discussion with patient, she declines x-ray, especially given limitations of plain film x-ray discussed.  Medications/Fluids: Ordered Toradol , p.o. Vicodin  Most recent vital signs reviewed and are as follows: BP (!) 127/96 (BP Location: Left Arm)   Pulse (!) 105   Temp 98.5 F (36.9 C) (Oral)   Resp 16   Ht 5' 9 (1.753 m)   Wt 63.5 kg   LMP 09/29/2023   SpO2 98%   BMI 20.67 kg/m   Initial impression: Low back pain without radicular features, no red flags.  Home treatment plan: Patient was counseled on back pain precautions and advised to do activity as tolerated but avoid strenuous activity and do not lift, push, or  pull heavy objects more than 10 pounds for the next week.  Patient counseled to use ice or heat on back as needed for pain and spasm.    Medications prescribed: Robaxin  for muscle pain and spasm. Patient counseled on proper use of muscle relaxant medication.  They were requested not to drink alcohol, drive any vehicle, or do any dangerous activities while taking this medication due to potential drowsiness and unintended.  Patient verbalized understanding.  Return instructions discussed with patient: Urged to return with worsening severe pain, loss of bowel or bladder control, trouble walking, development of weakness in the legs, or with any other concerns.  Follow-up instructions discussed with patient: Patient urged to follow-up with PCP if pain does not improve with treatment and rest or if pain becomes recurrent.                                    Medical Decision Making Risk Prescription drug management.   Patient with back pain. No neurological deficits. Patient is ambulatory. No warning symptoms of back pain including: fecal incontinence, urinary retention or overflow incontinence, night sweats, waking from sleep with back pain, unexplained fevers or weight loss, h/o cancer, IVDU, recent trauma. No concern for cauda equina, epidural abscess, or other serious cause of back pain. Conservative measures such as rest, ice/heat and pain medicine indicated with PCP follow-up if no improvement with conservative management.       Final diagnoses:  Acute bilateral low back pain without sciatica    ED Discharge Orders          Ordered    methocarbamol  (ROBAXIN ) 500 MG tablet  4 times daily        10/27/23 2211    HYDROcodone -acetaminophen  (NORCO/VICODIN) 5-325 MG tablet  Every 6 hours PRN        10/27/23 2211               Desiderio Chew, PA-C 10/27/23 2213    Neysa Caron PARAS, DO 10/27/23 2313

## 2023-10-27 NOTE — Discharge Instructions (Addendum)
 Please read and follow all provided instructions.  Your diagnoses today include:  1. Acute bilateral low back pain without sciatica    Tests performed today include: Vital signs - see below for your results today  Medications prescribed:  Robaxin  (methocarbamol ) - muscle relaxer medication  DO NOT drive or perform any activities that require you to be awake and alert because this medicine can make you drowsy.   Vicodin (hydrocodone /acetaminophen ) - narcotic pain medication  DO NOT drive or perform any activities that require you to be awake and alert because this medicine can make you drowsy. BE VERY CAREFUL not to take multiple medicines containing Tylenol  (also called acetaminophen ). Doing so can lead to an overdose which can damage your liver and cause liver failure and possibly death.  Take any prescribed medications only as directed.  Home care instructions:  Follow any educational materials contained in this packet Please rest, use ice or heat on your back for the next several days Do not lift, push, pull anything more than 10 pounds for the next week  Follow-up instructions: Please follow-up with your primary care provider or neurosurgery referral in the next 1 week for further evaluation of your symptoms if not improving.  Return instructions:  SEEK IMMEDIATE MEDICAL ATTENTION IF YOU HAVE: New numbness, tingling, weakness, or problem with the use of your arms or legs Severe back pain not relieved with medications Loss control of your bowels or bladder Increasing pain in any areas of the body (such as chest or abdominal pain) Shortness of breath, dizziness, or fainting.  Worsening nausea (feeling sick to your stomach), vomiting, fever, or sweats Any other emergent concerns regarding your health   Additional Information:  Your vital signs today were: BP (!) 127/96 (BP Location: Left Arm)   Pulse (!) 105   Temp 98.5 F (36.9 C) (Oral)   Resp 16   Ht 5' 9 (1.753 m)    Wt 63.5 kg   LMP 09/29/2023   SpO2 98%   BMI 20.67 kg/m  If your blood pressure (BP) was elevated above 135/85 this visit, please have this repeated by your doctor within one month. --------------
